# Patient Record
Sex: Female | Born: 1960 | Race: Black or African American | Hispanic: No | Marital: Married | Smoking: Never smoker
Health system: Southern US, Community
[De-identification: ages and names within clinical notes are randomized; demographics above are authoritative.]

## PROBLEM LIST (undated history)

## (undated) DIAGNOSIS — I10 Essential (primary) hypertension: Secondary | ICD-10-CM

---

## 2021-01-20 ENCOUNTER — Other Ambulatory Visit: Payer: Self-pay

## 2021-01-20 ENCOUNTER — Ambulatory Visit
Admission: EM | Admit: 2021-01-20 | Discharge: 2021-01-20 | Disposition: A | Discharge status: Home / Self Care | Payer: No Typology Code available for payment source | Attending: Physician Assistant | Admitting: Physician Assistant

## 2021-01-20 DIAGNOSIS — R8290 Unspecified abnormal findings in urine: Secondary | ICD-10-CM | POA: Diagnosis not present

## 2021-01-20 DIAGNOSIS — M25522 Pain in left elbow: Secondary | ICD-10-CM | POA: Diagnosis not present

## 2021-01-20 DIAGNOSIS — R300 Dysuria: Secondary | ICD-10-CM | POA: Diagnosis not present

## 2021-01-20 HISTORY — DX: Essential (primary) hypertension: I10

## 2021-01-20 LAB — URINALYSIS, COMPLETE (UACMP) WITH MICROSCOPIC
Bilirubin Urine: NEGATIVE
Glucose, UA: NEGATIVE mg/dL
Hgb urine dipstick: NEGATIVE
Ketones, ur: NEGATIVE mg/dL
Nitrite: NEGATIVE
Protein, ur: NEGATIVE mg/dL
Specific Gravity, Urine: 1.02 (ref 1.005–1.030)
pH: 5.5 (ref 5.0–8.0)

## 2021-01-20 MED ORDER — NITROFURANTOIN MONOHYD MACRO 100 MG PO CAPS: 100.0000 mg | ORAL_CAPSULE | Freq: Two times a day (BID) | ORAL | 0 refills | Status: DC

## 2021-01-20 MED ORDER — MELOXICAM 7.5 MG PO TABS: 7.5000 mg | ORAL_TABLET | Freq: Every day | ORAL | 0 refills | Status: DC

## 2021-01-20 NOTE — ED Triage Notes (Signed)
Pt reports having burning with urination x2 weeks.   Also c/o L arm pain. Sts she was told she had tennis elbow 4 months ago and the pain is not getting any better.

## 2021-01-20 NOTE — Discharge Instructions (Addendum)
There are some white blood cells in the urine which could represent infection.  Since you have not had any painful urination in a few days, I believe you can hold off on taking antibiotics until the culture comes back in 2 to 3 days.  Someone should contact you and let you know the results in a couple of days.  At this time increase rest and fluids and if you have any return of pain go ahead and start the antibiotic.  Otherwise, it could be related to the soaps that you are using.  Make sure to use unscented soaps.  ELBOW PAIN: Stressed avoiding painful activities . Reviewed RICE guidelines. Use medications as directed, including NSAIDs. If no NSAIDs have been prescribed for you today, you may take Aleve or Motrin over the counter. May use Tylenol in between doses of NSAIDs.  If no improvement in the next 1-2 weeks, f/u with PCP or return to our office for reexamination, and please feel free to call or return at any time for any questions or concerns you may have and we will be happy to help you!      You may have a condition requiring you to follow up with Orthopedics so please call one of the following office for appointment:   [facility] [address] [place], [place] [address] Phone: [phone]  [facility] [address], [place], [place] [address] Phone: [phone]

## 2021-01-20 NOTE — ED Provider Notes (Signed)
[facility]    CSN: [id number] Arrival date & time: 01/20/21  1631      History   Chief Complaint Chief Complaint  Patient presents with   UTI Symptoms    HPI Melissa Jefferson is a 60 y.o. female patient for multiple complaints.  First she states that she has been having left elbow pain off and on over the past 4 to 5 months.  Patient is from [place] and recently located in [place] to be closer to her family.  She says that she often lifts her young granddaughter and that is what seems to cause the pain.  Patient says that her elbow feels fine right now and she has not really having any pain but she will have pain whenever she lifts anything too heavy.  Also admits to occasionally having pain whenever she pronates her wrist.  No associated numbness, weakness or tingling.  Patient states that she went to another urgent care in [place] when her symptoms started and had an x-ray which was normal.  She says that she has not really taken anything for pain or applied ice or used any sort of bracing or stopped lifting her granddaughter and her symptoms have continued.  Additionally, she complains of intermittent dysuria over the past 2 weeks.  Patient says that has been a couple of days and she has had the dysuria.  It is not associated with any urinary frequency urgency or hematuria.  Denies vaginal discharge, itching or odor.  Patient concerned about possible UTI.  No fevers, back pain or abdominal pain.  No nausea/vomiting or diarrhea.  No other complaints.  HPI  Past Medical History:  Diagnosis Date   Hypertension     There are no problems to display for this patient.   History reviewed. No pertinent surgical history.  OB History   No obstetric history on file.      Home Medications    Prior to Admission medications   Medication Sig Start Date End Date Taking? Authorizing Provider  meloxicam (MOBIC) 7.5 MG tablet Take 1 tablet (7.5 mg total) by mouth daily.  01/20/21 02/19/21 Yes Shirlee Latch, PA-C  nitrofurantoin, macrocrystal-monohydrate, (MACROBID) 100 MG capsule Take 1 capsule (100 mg total) by mouth 2 (two) times daily. 01/20/21  Yes Eusebio Friendly B, PA-C  NIFEdipine (PROCARDIA-XL/NIFEDICAL-XL) 30 MG 24 hr tablet nifedipine ER 30 mg tablet,extended release 24 hr  TAKE 1 TABLET BY MOUTH DAILY    [provider]    Family History No family history on file.  Social History Social History   Tobacco Use   Smoking status: Never   Smokeless tobacco: Never  Substance Use Topics   Alcohol use: Not Currently   Drug use: Never     Allergies   Patient has no known allergies.   Review of Systems Review of Systems  Constitutional:  Negative for chills and fever.  Gastrointestinal:  Negative for abdominal pain, diarrhea, nausea and vomiting.  Genitourinary:  Positive for dysuria. Negative for decreased urine volume, flank pain, frequency, hematuria, pelvic pain, urgency, vaginal bleeding, vaginal discharge and vaginal pain.  Musculoskeletal:  Positive for arthralgias. Negative for back pain and joint swelling.  Skin:  Negative for rash.    Physical Exam Triage Vital Signs ED Triage Vitals [01/20/21 1648]  Enc Vitals Group     BP (!) 147/103     Pulse Rate 82     Resp 16     Temp 98.1 F (36.7  C)     Temp Source Oral     SpO2 97 %     Weight 160 lb (72.6 kg)     Height 5\' 4"  (1.626 m)     Head Circumference      Peak Flow      Pain Score 6     Pain Loc      Pain Edu?      Excl. in GC?    No data found.  Updated Vital Signs BP (!) 147/103   Pulse 82   Temp 98.1 F (36.7 C) (Oral)   Resp 16   Ht 5\' 4"  (1.626 m)   Wt 160 lb (72.6 kg)   LMP  (LMP Unknown)   SpO2 97%   BMI 27.46 kg/m       Physical Exam Vitals and nursing note reviewed.  Constitutional:      General: She is not in acute distress.    Appearance: Normal appearance. She is not ill-appearing or toxic-appearing.  HENT:     Head:  Normocephalic and atraumatic.  Eyes:     General: No scleral icterus.       Right eye: No discharge.        Left eye: No discharge.     Conjunctiva/sclera: Conjunctivae normal.  Cardiovascular:     Rate and Rhythm: Normal rate and regular rhythm.     Heart sounds: Normal heart sounds.  Pulmonary:     Effort: Pulmonary effort is normal. No respiratory distress.     Breath sounds: Normal breath sounds.  Abdominal:     Palpations: Abdomen is soft.     Tenderness: There is no abdominal tenderness. There is no right CVA tenderness or left CVA tenderness.  Musculoskeletal:     Right elbow: Normal.     Left elbow: Normal.     Cervical back: Neck supple.     Comments: Patient has no tenderness to palpation of any part of the elbow and has full range of motion.  5 out of 5 strength bilaterally of upper extremities.  Skin:    General: Skin is dry.  Neurological:     General: No focal deficit present.     Mental Status: She is alert. Mental status is at baseline.     Motor: No weakness.     Gait: Gait normal.  Psychiatric:        Mood and Affect: Mood normal.        Behavior: Behavior normal.        Thought Content: Thought content normal.     UC Treatments / Results  Labs (all labs ordered are listed, but only abnormal results are displayed) Labs Reviewed  URINALYSIS, COMPLETE (UACMP) WITH MICROSCOPIC - Abnormal; Notable for the following components:      Result Value   Leukocytes,Ua SMALL (*)    Bacteria, UA FEW (*)    All other components within normal limits  URINE CULTURE    EKG   Radiology No results found.  Procedures Procedures (including critical care time)  Medications Ordered in UC Medications - No data to display  Initial Impression / Assessment and Plan / UC Course  I have reviewed the triage vital signs and the nursing notes.  Pertinent labs & imaging results that were available during my care of the patient were reviewed by me and considered in my  medical decision making (see chart for details).  60 year old female presenting for left elbow pain and dysuria.  On exam today,  she has no tenderness to palpation of the elbow and has full range of motion without pain.  Good strength and sensation.  Advised her to avoid exacerbating factors.  I have sent in meloxicam.  Advised her to ice the elbow whenever it hurts.  Follow-up with Ortho if not improving over the next 2 weeks with the meloxicam and her avoidance of lifting.  Also, advised her that she could see physical therapy if she would prefer that over Ortho or even see both.  Urinalysis performed today shows small leukocytes and few bacteria.  We will culture urine.  Since she has not had dysuria in the past couple of days, I have printed a prescription for Macrobid.  Advised her that if she has burning with urination again then she should take the medication or if we contact her and let her know that her culture is positive in the next couple of days.  Advised increasing rest and fluids and avoiding scented soaps.  Follow-up as needed.   Final Clinical Impressions(s) / UC Diagnoses   Final diagnoses:  Dysuria  Abnormal urinalysis  Left elbow pain     Discharge Instructions      There are some white blood cells in the urine which could represent infection.  Since you have not had any painful urination in a few days, I believe you can hold off on taking antibiotics until the culture comes back in 2 to 3 days.  Someone should contact you and let you know the results in a couple of days.  At this time increase rest and fluids and if you have any return of pain go ahead and start the antibiotic.  Otherwise, it could be related to the soaps that you are using.  Make sure to use unscented soaps.  ELBOW PAIN: Stressed avoiding painful activities . Reviewed RICE guidelines. Use medications as directed, including NSAIDs. If no NSAIDs have been prescribed for you today, you may take Aleve or  Motrin over the counter. May use Tylenol in between doses of NSAIDs.  If no improvement in the next 1-2 weeks, f/u with PCP or return to our office for reexamination, and please feel free to call or return at any time for any questions or concerns you may have and we will be happy to help you!      You may have a condition requiring you to follow up with Orthopedics so please call one of the following office for appointment:   [facility] [address] [place], [place] [address] Phone: [phone]  [facility] [address], [place], [place] [address] Phone: [phone]      ED Prescriptions     Medication Sig Dispense Auth. Provider   nitrofurantoin, macrocrystal-monohydrate, (MACROBID) 100 MG capsule Take 1 capsule (100 mg total) by mouth 2 (two) times daily. 10 capsule Eusebio Friendly B, PA-C   meloxicam (MOBIC) 7.5 MG tablet Take 1 tablet (7.5 mg total) by mouth daily. 30 tablet Shirlee Latch, PA-C      I have reviewed the PDMP during this encounter.   Shirlee Latch, PA-C 01/20/21 1744

## 2021-01-22 LAB — URINE CULTURE: Culture: <10000 — AB

## 2021-02-08 ENCOUNTER — Ambulatory Visit (INDEPENDENT_AMBULATORY_CARE_PROVIDER_SITE_OTHER): Payer: 59

## 2021-02-08 ENCOUNTER — Ambulatory Visit
Admission: EM | Admit: 2021-02-08 | Discharge: 2021-02-08 | Disposition: A | Discharge status: Home / Self Care | Payer: 59 | Attending: Physician Assistant | Admitting: Physician Assistant

## 2021-02-08 ENCOUNTER — Other Ambulatory Visit: Payer: Self-pay

## 2021-02-08 DIAGNOSIS — R52 Pain, unspecified: Secondary | ICD-10-CM

## 2021-02-08 DIAGNOSIS — M533 Sacrococcygeal disorders, not elsewhere classified: Secondary | ICD-10-CM | POA: Diagnosis not present

## 2021-02-08 MED ORDER — MELOXICAM 7.5 MG PO TABS: 7.5000 mg | ORAL_TABLET | Freq: Every day | ORAL | 0 refills | Status: AC
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
## 2021-02-08 NOTE — ED Triage Notes (Signed)
Pt states she noticed 1 week ago that the area around her tailbone has started to hurt when she goes to sit, no bumps/lumbs/ No falls/injury.

## 2021-02-08 NOTE — Discharge Instructions (Addendum)
The x-ray looks good.  Take the anti-inflammatory medication as prescribed and ice the area.  He can also take Tylenol.  Consider use of a donut cushion.  Keep follow-up appointment with your primary next month.  Contact them to see if think you on a cancellation list sooner.

## 2021-03-25 ENCOUNTER — Ambulatory Visit: Payer: 59 | Admitting: Internal Medicine

## 2021-12-20 ENCOUNTER — Ambulatory Visit: Payer: Self-pay | Admitting: Nurse Practitioner

## 2022-05-05 IMAGING — CR DG SACRUM/COCCYX 2+V
4 series · 4 of 4 positions shown · non-contrast
Comparison: None.

CLINICAL DATA: Coccygeal pain, no injury

EXAM:
SACRUM AND COCCYX - 2+ VIEW

[coccyx ap]
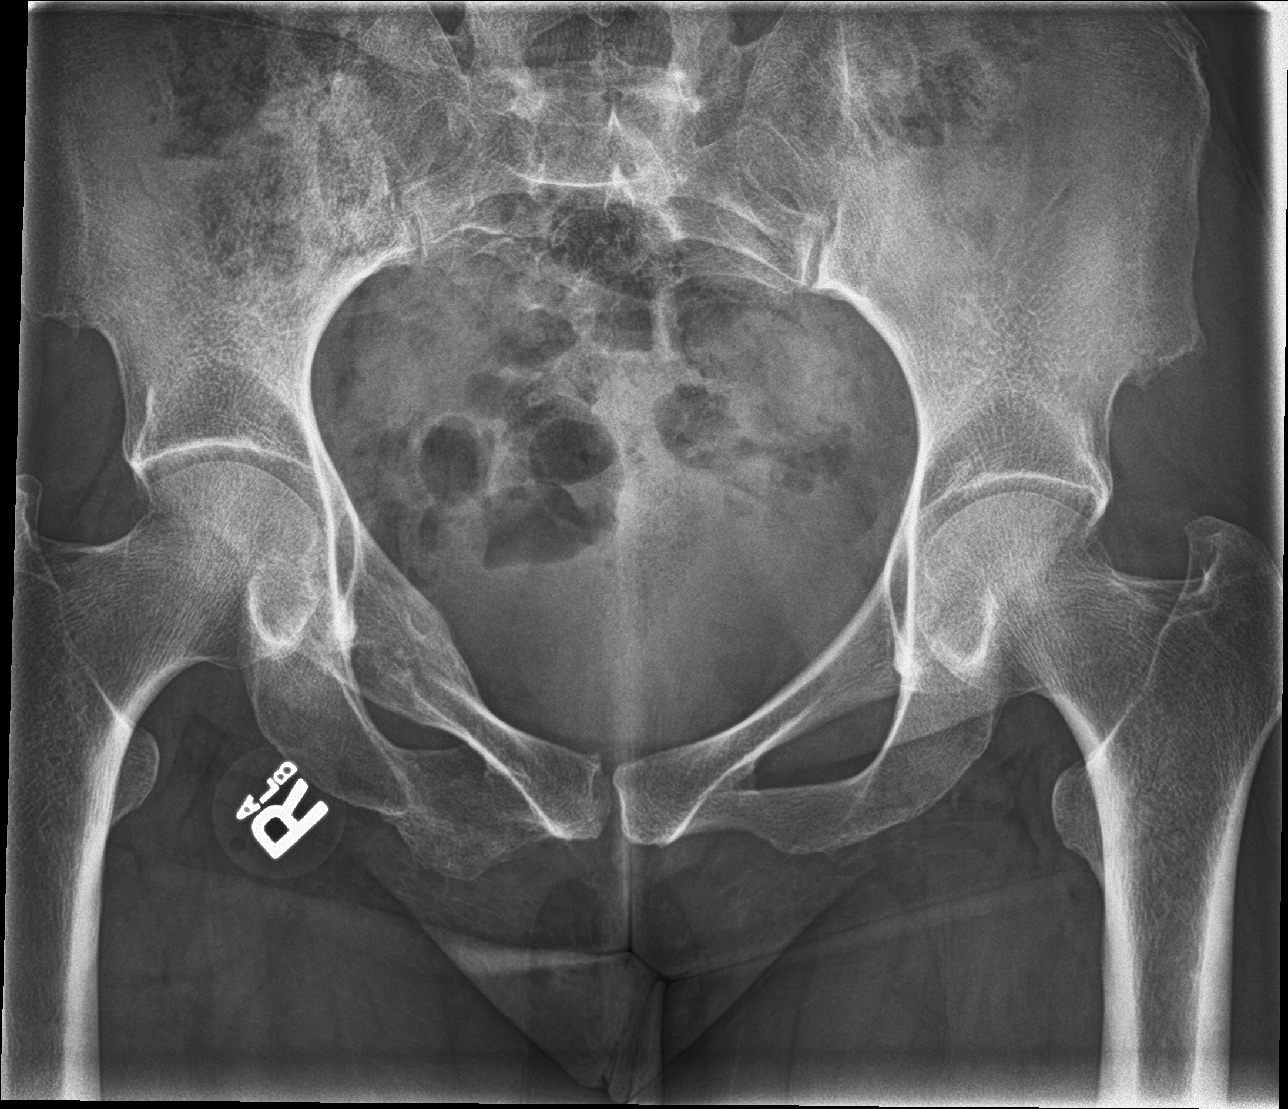

[sacrum ap]
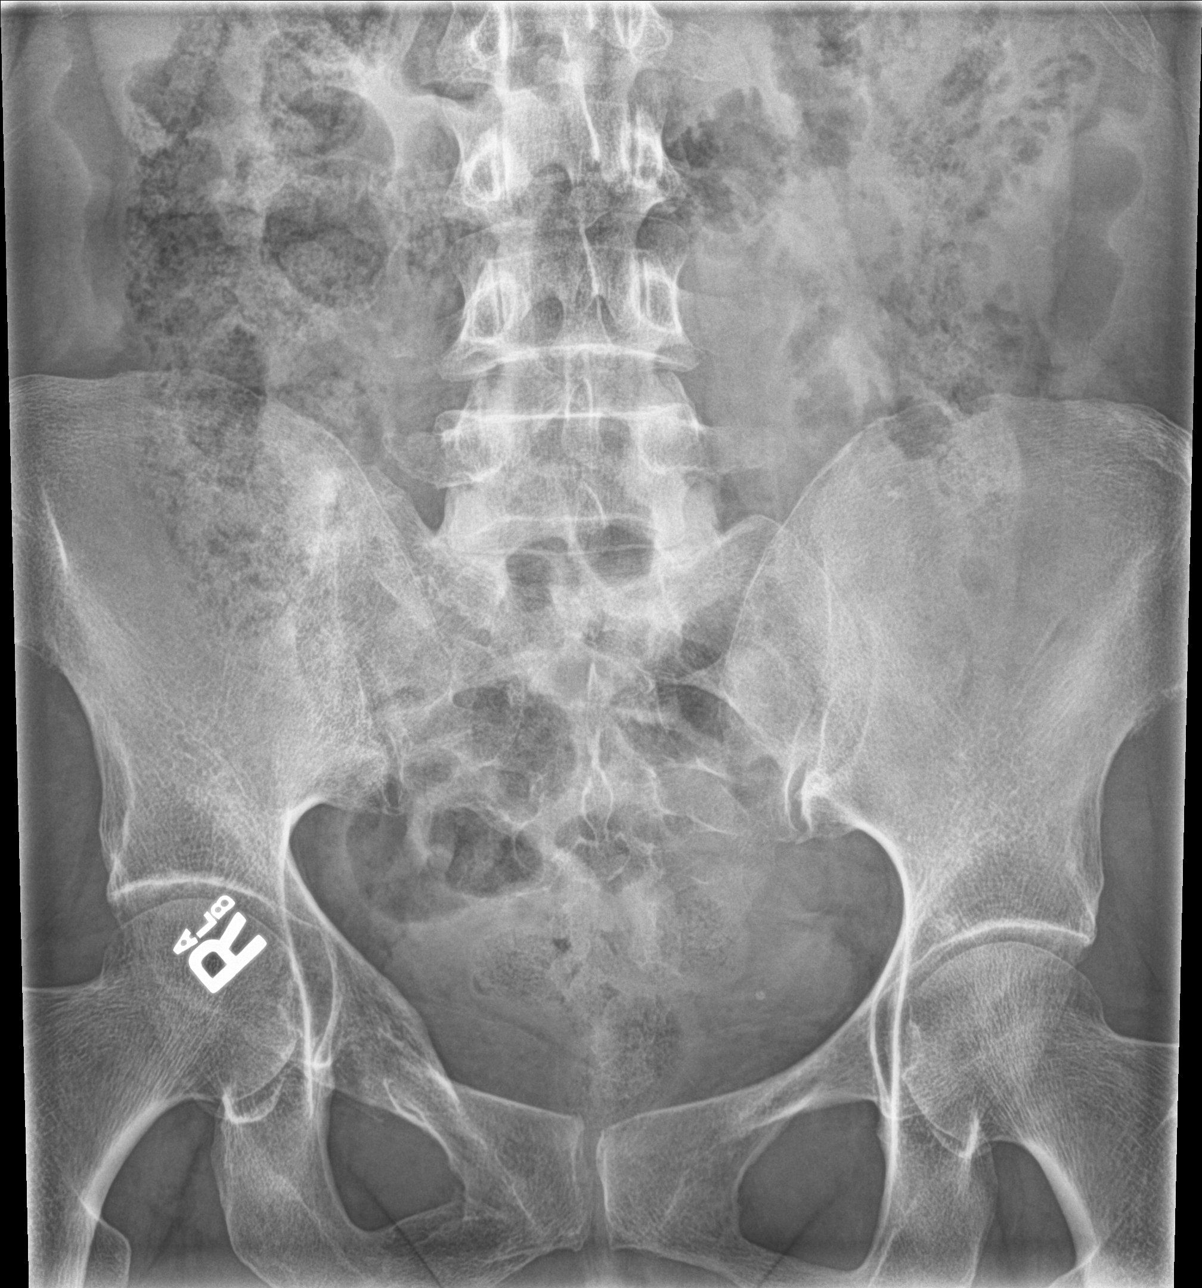

[sacrum lat (1 of 2)]
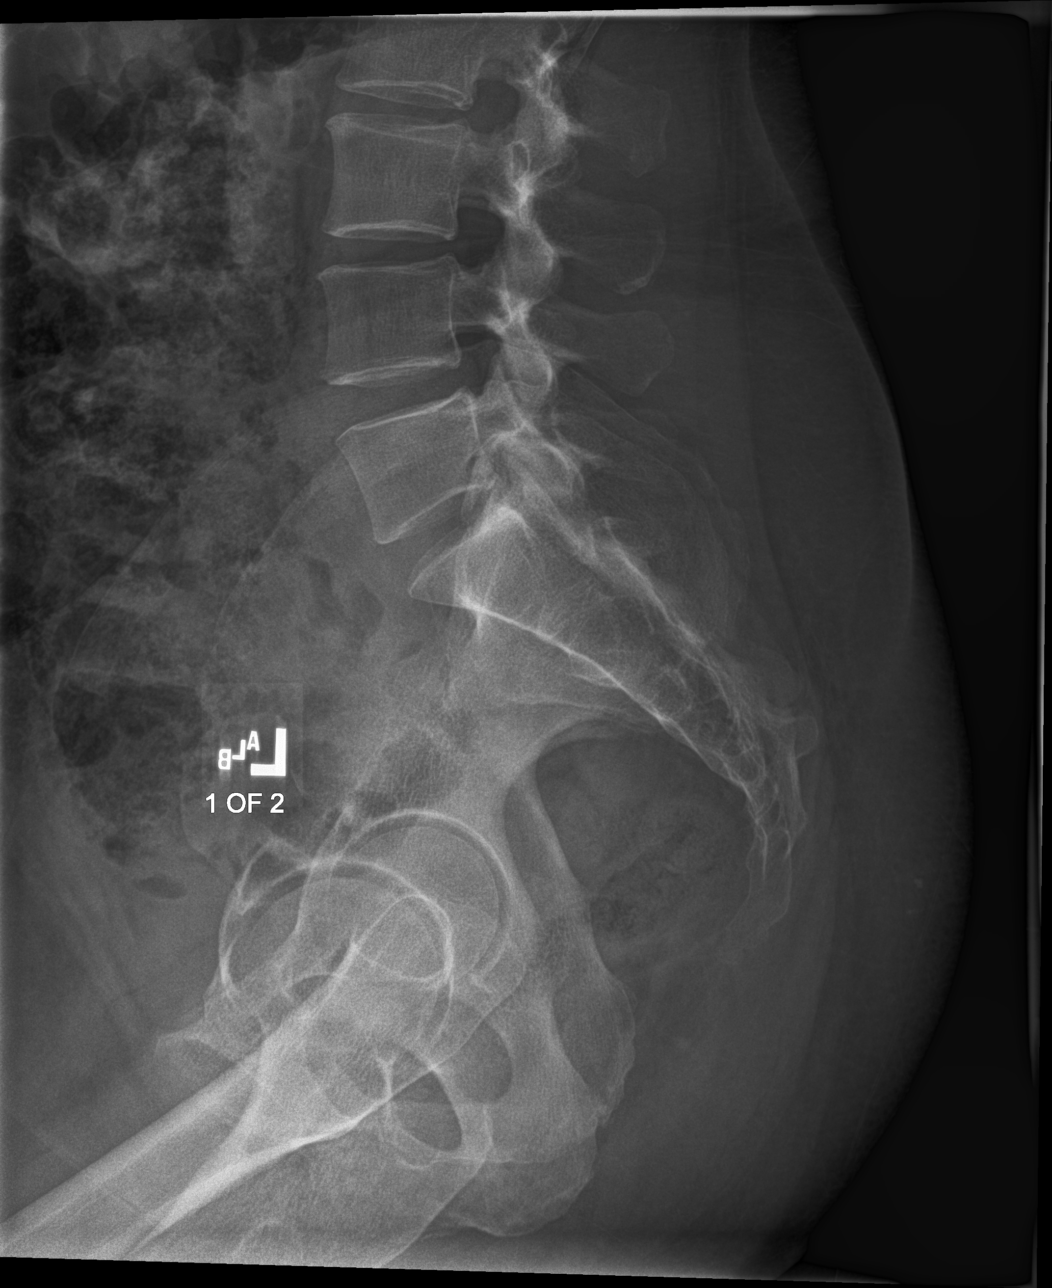

[sacrum lat (2 of 2)]
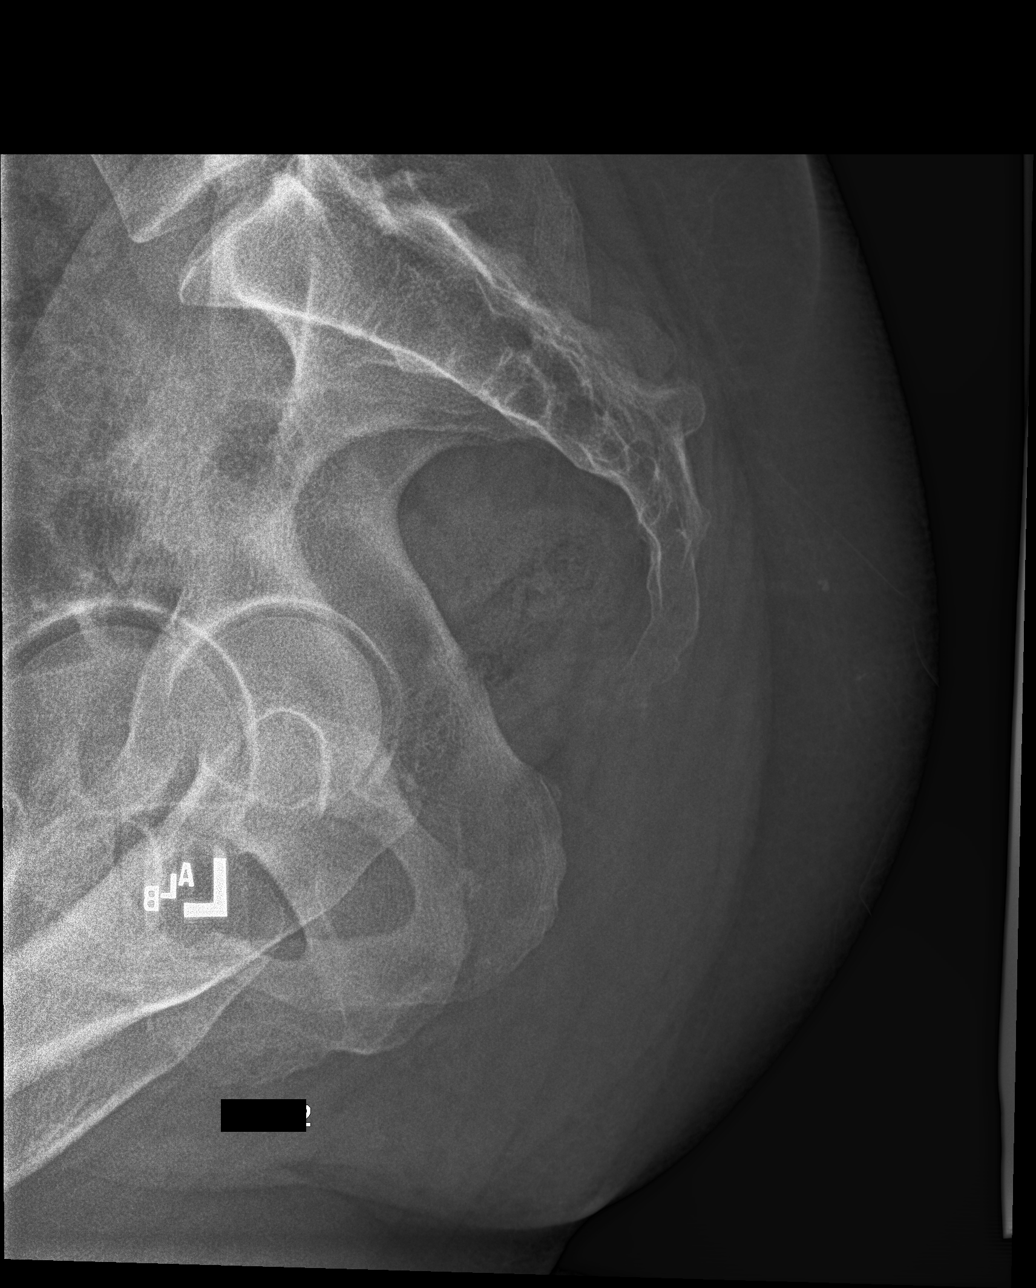

[4 of 4 positions shown; findings below may reference images not displayed]

FINDINGS: No displaced fracture or dislocation of the sacrum or coccyx.
Chronic fracture deformities of the right superior and inferior
pubic rami. Nonobstructive pattern of overlying bowel gas.
IMPRESSION: 1.  No displaced fracture or dislocation of the sacrum or coccyx.

2. Chronic fracture deformities of the right superior and inferior
pubic rami.

## 2023-10-02 ENCOUNTER — Encounter: Payer: Self-pay | Admitting: *Deleted

## 2023-10-02 ENCOUNTER — Ambulatory Visit: Admission: EM | Admit: 2023-10-02 | Discharge: 2023-10-02 | Disposition: A | Discharge status: Home / Self Care

## 2023-10-02 DIAGNOSIS — I10 Essential (primary) hypertension: Secondary | ICD-10-CM | POA: Diagnosis not present

## 2023-10-02 DIAGNOSIS — M5431 Sciatica, right side: Secondary | ICD-10-CM | POA: Diagnosis not present

## 2023-10-02 MED ORDER — NAPROXEN 500 MG PO TABS: 500.0000 mg | ORAL_TABLET | Freq: Two times a day (BID) | ORAL | 0 refills | Status: AC | PRN

## 2023-10-02 NOTE — ED Provider Notes (Signed)
 [facility]    CSN: [id number] Arrival date & time: 10/02/23  1708      History   Chief Complaint Chief Complaint  Patient presents with   Back Pain   Leg Pain    HPI Melissa Jefferson is a 63 y.o. female presenting for approximately 5-day history of pain of the lower back with radiation to the right buttocks and right posterior thigh.  Denies injury.  Reports that she works a lot.  States that she does the rowing machine and walks on a treadmill.  Also recently did some yoga.  Denies squatting or dead lifting.  Denies numbness, weakness or tingling of the extremity.  No history of back pain.  Has history of broken pelvis on the right side nearly 20 years ago but has not really had issues with her pelvis or hip.  Never had a surgery.  No loss of bowel or bladder control.  Has not been taking any OTC meds because she does not like to take medications.  History of hypertension.  Takes lisinopril HCTZ and nifedipine.  HPI  Past Medical History:  Diagnosis Date   Hypertension     There are no active problems to display for this patient.   History reviewed. No pertinent surgical history.  OB History   No obstetric history on file.      Home Medications    Prior to Admission medications   Medication Sig Start Date End Date Taking? Authorizing Provider  lisinopril-hydrochlorothiazide (ZESTORETIC) 20-12.5 MG tablet Take 1 tablet by mouth daily. 07/27/23  Yes [provider]  naproxen (NAPROSYN) 500 MG tablet Take 1 tablet (500 mg total) by mouth 2 (two) times daily as needed. 10/02/23  Yes Eusebio Friendly B, PA-C  NIFEdipine (PROCARDIA-XL/NIFEDICAL-XL) 30 MG 24 hr tablet nifedipine ER 30 mg tablet,extended release 24 hr  TAKE 1 TABLET BY MOUTH DAILY    [provider]    Family History History reviewed. No pertinent family history.  Social History Social History   Tobacco Use   Smoking status: Never   Smokeless tobacco: Never  Vaping Use   Vaping  status: Never Used  Substance Use Topics   Alcohol use: Not Currently   Drug use: Never     Allergies   Patient has no known allergies.   Review of Systems Review of Systems  Constitutional:  Negative for fatigue.  Respiratory:  Negative for shortness of breath.   Cardiovascular:  Negative for chest pain.  Musculoskeletal:  Positive for back pain. Negative for arthralgias, gait problem and joint swelling.  Skin:  Negative for rash and wound.  Neurological:  Negative for weakness and numbness.     Physical Exam Triage Vital Signs ED Triage Vitals  Encounter Vitals Group     BP 10/02/23 1811 (!) 170/91     Systolic BP Percentile --      Diastolic BP Percentile --      Pulse Rate 10/02/23 1811 78     Resp 10/02/23 1811 18     Temp 10/02/23 1811 98 F (36.7 C)     Temp Source 10/02/23 1811 Oral     SpO2 10/02/23 1811 98 %     Weight 10/02/23 1805 148 lb (67.1 kg)     Height 10/02/23 1805 5\' 4"  (1.626 m)     Head Circumference --      Peak Flow --      Pain Score 10/02/23 1805 6     Pain Loc --  Pain Education --      Exclude from Growth Chart --    No data found.  Updated Vital Signs BP (!) 163/82 Comment: checked twice, hasn't taken BP med, provider notified  Pulse 78   Temp 98 F (36.7 C) (Oral)   Resp 18   Ht 5\' 4"  (1.626 m)   Wt 148 lb (67.1 kg)   LMP  (LMP Unknown)   SpO2 98%   BMI 25.40 kg/m     Physical Exam Vitals and nursing note reviewed.  Constitutional:      General: She is not in acute distress.    Appearance: Normal appearance. She is not ill-appearing or toxic-appearing.  HENT:     Head: Normocephalic and atraumatic.  Eyes:     General: No scleral icterus.       Right eye: No discharge.        Left eye: No discharge.     Conjunctiva/sclera: Conjunctivae normal.  Cardiovascular:     Rate and Rhythm: Normal rate and regular rhythm.     Heart sounds: Normal heart sounds.  Pulmonary:     Effort: Pulmonary effort is normal. No  respiratory distress.     Breath sounds: Normal breath sounds.  Musculoskeletal:     Cervical back: Neck supple.     Comments: Back: Tenderness throughout the lumbar region.  Tenderness of right sciatic notch and glutes.  Straight leg raise bilaterally  Skin:    General: Skin is dry.  Neurological:     General: No focal deficit present.     Mental Status: She is alert. Mental status is at baseline.     Motor: No weakness.     Gait: Gait normal.  Psychiatric:        Mood and Affect: Mood normal.        Behavior: Behavior normal.      UC Treatments / Results  Labs (all labs ordered are listed, but only abnormal results are displayed) Labs Reviewed - No data to display  EKG   Radiology No results found.  Procedures Procedures (including critical care time)  Medications Ordered in UC Medications - No data to display  Initial Impression / Assessment and Plan / UC Course  I have reviewed the triage vital signs and the nursing notes.  Pertinent labs & imaging results that were available during my care of the patient were reviewed by me and considered in my medical decision making (see chart for details).   63 year old female with history of hypertension presents for lower back pain with radiation to the right buttocks and right posterior thigh for the past 3 days.  Denies injury.  Works out a lot, with walking on treadmill and yoga.  No numbness/tingling or weakness.  No red flag signs or symptoms.  BP elevated at 170/91.  Recheck 163/82.  Takes nifedipine and lisinopril HCTZ.  Advised to keep log of blood pressure and if consistently greater than 140/90 should consider speaking with PCP about medication adjustment.  Atraumatic lower back pain.  Suspect lumbar radiculopathy.  Treating at this time with naproxen.  Also advised to follow RICE guidelines.  Advised following up with orthopedics or PCP if not improving in the next couple weeks.  Red flag signs and symptoms discussed  with patient and advised to go to ED if any of those occur.   Final Clinical Impressions(s) / UC Diagnoses   Final diagnoses:  Sciatica of right side  Essential hypertension     Discharge Instructions  BACK PAIN: Stressed avoiding painful activities . RICE (REST, ICE, COMPRESSION, ELEVATION) guidelines reviewed. May alternate ice and heat. Consider use of muscle rubs, Salonpas patches, etc. Use medications as directed including muscle relaxers if prescribed. Take anti-inflammatory medications as prescribed or OTC NSAIDs/Tylenol.  F/u with PCP or ortho if not improving in the next 2 weeks.  BACK PAIN RED FLAGS: If the back pain acutely worsens or there are any red flag symptoms such as numbness/tingling, leg weakness, saddle anesthesia, or loss of bowel/bladder control, go immediately to the ER. Follow up with [place] as scheduled or sooner if the pain does not begin to resolve or if it worsens before the follow up      ED Prescriptions     Medication Sig Dispense Auth. Provider   naproxen (NAPROSYN) 500 MG tablet Take 1 tablet (500 mg total) by mouth 2 (two) times daily as needed. 30 tablet Gareth Morgan      PDMP not reviewed this encounter.   Shirlee Latch, PA-C 10/02/23 [phone]

## 2023-10-02 NOTE — Discharge Instructions (Signed)
 BACK PAIN: Stressed avoiding painful activities . RICE (REST, ICE, COMPRESSION, ELEVATION) guidelines reviewed. May alternate ice and heat. Consider use of muscle rubs, Salonpas patches, etc. Use medications as directed including muscle relaxers if prescribed. Take anti-inflammatory medications as prescribed or OTC NSAIDs/Tylenol.  F/u with PCP or ortho if not improving in the next 2 weeks.  BACK PAIN RED FLAGS: If the back pain acutely worsens or there are any red flag symptoms such as numbness/tingling, leg weakness, saddle anesthesia, or loss of bowel/bladder control, go immediately to the ER. Follow up with [place] as scheduled or sooner if the pain does not begin to resolve or if it worsens before the follow up

## 2023-10-02 NOTE — ED Triage Notes (Signed)
 Patient states 5 days of lower back pain and pain that shoots down right leg.  Works out several times a week but this is not new, no urinary symptoms.  Not using OTC pain med

## 2024-10-08 ENCOUNTER — Ambulatory Visit: Admitting: Nurse Practitioner
